# Patient Record
Sex: Male | Born: 1950 | Race: White | Hispanic: No | Marital: Married | Smoking: Never smoker
Health system: Southern US, Community
[De-identification: ages and names within clinical notes are randomized; demographics above are authoritative.]

## PROBLEM LIST (undated history)

## (undated) DIAGNOSIS — I2699 Other pulmonary embolism without acute cor pulmonale: Secondary | ICD-10-CM

## (undated) DIAGNOSIS — M1990 Unspecified osteoarthritis, unspecified site: Secondary | ICD-10-CM

## (undated) DIAGNOSIS — I82409 Acute embolism and thrombosis of unspecified deep veins of unspecified lower extremity: Secondary | ICD-10-CM

## (undated) DIAGNOSIS — E785 Hyperlipidemia, unspecified: Secondary | ICD-10-CM

## (undated) DIAGNOSIS — G459 Transient cerebral ischemic attack, unspecified: Secondary | ICD-10-CM

## (undated) HISTORY — PX: SHOULDER SURGERY: SHX246

## (undated) HISTORY — PX: ELBOW ARTHROPLASTY: SHX928

## (undated) HISTORY — PX: INSERTION OF VENA CAVA FILTER: SHX5871

## (undated) HISTORY — PX: KNEE SURGERY: SHX244

---

## 2009-06-28 ENCOUNTER — Emergency Department (HOSPITAL_COMMUNITY): Admission: EM | Admit: 2009-06-28 | Discharge: 2009-06-28 | Payer: Self-pay | Admitting: Emergency Medicine

## 2009-06-29 ENCOUNTER — Emergency Department (HOSPITAL_COMMUNITY): Admission: EM | Admit: 2009-06-29 | Discharge: 2009-06-29 | Payer: Self-pay | Admitting: Emergency Medicine

## 2012-07-18 ENCOUNTER — Encounter: Payer: Self-pay | Admitting: Orthopedic Surgery

## 2012-07-18 ENCOUNTER — Ambulatory Visit (INDEPENDENT_AMBULATORY_CARE_PROVIDER_SITE_OTHER): Payer: PRIVATE HEALTH INSURANCE

## 2012-07-18 ENCOUNTER — Ambulatory Visit (INDEPENDENT_AMBULATORY_CARE_PROVIDER_SITE_OTHER): Payer: BC Managed Care – PPO | Admitting: Orthopedic Surgery

## 2012-07-18 VITALS — BP 154/98 | Ht 75.0 in | Wt 238.0 lb

## 2012-07-18 DIAGNOSIS — IMO0002 Reserved for concepts with insufficient information to code with codable children: Secondary | ICD-10-CM

## 2012-07-18 DIAGNOSIS — M1710 Unilateral primary osteoarthritis, unspecified knee: Secondary | ICD-10-CM

## 2012-07-18 DIAGNOSIS — M25561 Pain in right knee: Secondary | ICD-10-CM | POA: Insufficient documentation

## 2012-07-18 DIAGNOSIS — Z86711 Personal history of pulmonary embolism: Secondary | ICD-10-CM

## 2012-07-18 DIAGNOSIS — M1712 Unilateral primary osteoarthritis, left knee: Secondary | ICD-10-CM

## 2012-07-18 DIAGNOSIS — M25569 Pain in unspecified knee: Secondary | ICD-10-CM

## 2012-07-18 MED ORDER — DIPHENHYDRAMINE HCL 25 MG PO CAPS: 25.0000 mg | ORAL_CAPSULE | ORAL | Status: DC | PRN

## 2012-07-18 MED ORDER — HYDROCODONE-ACETAMINOPHEN 5-325 MG PO TABS: 1.0000 | ORAL_TABLET | Freq: Four times a day (QID) | ORAL | Status: DC | PRN

## 2012-07-18 NOTE — Patient Instructions (Addendum)
You have received a steroid shot. 15% of patients experience increased pain at the injection site with in the next 24 hours. This is best treated with ice and tylenol extra strength 2 tabs every 8 hours. If you are still having pain please call the office.   Take pain medication as needed with benadryl

## 2012-07-18 NOTE — Progress Notes (Signed)
Patient ID: Joshua Oneal, male   DOB: [birth year], 62 y.o.   MRN: [id number] Chief Complaint  Patient presents with  . 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 He presents now complaining of a mass over the medial aspect of his right knee. So far has only been taking Tylenol for his knee pain which can become 10 out of 10 constant and worse with standing and walking he also complains of increased pain with bending his knee and swelling of his knee  He has other orthopedic problems related to his cervical and lumbar spine  He has a history of easy bleeding and bruising secondary to his Coumadin use. He tells me that his doctor who manages his Coumadin is in [place] and has been managing it from there for 5 years he is currently on 3 mg of Coumadin and he says that keeps his INR from 1.9-2.0 as long as he takes or eats green vegetables  He reported negative findings under the remaining review of systems  He reported a drop in blood pressure second Percocet several several years back he has a history of DVT right knee arthroscopy left elbow surgery shoulder surgery he is on Coumadin and Lipitor  Listed family history is normal  Social history married retired  BP 154/98  Ht 6\' 3"  (1.905 m)  Wt 238 lb (107.956 kg)  BMI 29.75 kg/m2  General appearance is normal, the patient is alert and oriented x3 with normal mood and affect. He ambulates with positive extremely positive for progression angles (he says this is been present since he was a child)  Upper extremity exam  The right and left upper extremity:   Inspection and palpation revealed no abnormalities in the upper extremities.   Range of motion is full without contracture.  Motor exam is normal with grade 5  strength.  The joints are fully reduced without subluxation.  There is no atrophy or tremor and muscle tone is normal.  All joints are stable.   History left knee is not involved inspection was normal range of motion was full strength was normal stability tests were normal skin was intact no para-) normal sensation  He has 125 of knee flexion slight flexion contractures knee is stable he has some medial joint line tenderness small effusion strength is normal skin is intact with multiple areas of Darkened skin primarily in the ankle leg area from his clots  left leg is normal no venous stasis disease on the left normal pulses normal temperature no current edema has some varicosities bilaterally has normal sensation  X-ray shows medial osteoarthritis moderate  Encounter Diagnosis  Name Primary?  . Right knee pain Yes    Very difficult patient, history of pulmonary embolus multiple times, filter, Coumadin with no local management of his medication. Is not a candidate for NSAID therapy  Is not a surgical candidate at this point without multiple specialists involved  He is one of the rare level V medical decision-making patients that I have seen  But we are going to inject his knee and then we are going to give him some pain medicine with Benadryl and see him again in 3 months  Knee  Injection Procedure Note  Pre-operative Diagnosis: right knee oa  Post-operative Diagnosis: same  Indications: pain  Anesthesia: ethyl chloride   Procedure Details   Verbal consent was obtained for the procedure. Time out was completed.The joint was prepped with alcohol, followed by  Ethyl chloride spray and A 20 gauge needle was inserted into the knee via lateral approach; 4ml 1% lidocaine and 1 ml of depomedrol  was then injected into the joint . The needle was removed and the area cleansed and dressed.  Complications:  None; patient tolerated the procedure well.

## 2012-07-24 ENCOUNTER — Telehealth: Payer: Self-pay | Admitting: *Deleted

## 2012-07-24 NOTE — Telephone Encounter (Signed)
Patient called c/o having extreme pain in right knee. States it feels like it is running up from where the cyst is on the right knee. He said "feels like someone sticking an ice pick inside the knee". Patient has tried elevating, he denies swelling. States pain on a scale of 1 - 10 is a 20.States he hasn't done anything differently to cause this pain, it just started after coming out of [employer]. Pharmacy didn't fill pain medicine d/t allergy to first ingredient, but patient states pain is so bad no pain medicine would touch it. I advised patient to go to Emergency Room due to his history of blood clots. Patient advised me that it was not a clot, he knows what that feels like. I still advised him that our Dr. Was in surgery, and  I recommend he got to ED. He did not want to go to [facility] because he felt like they were not equipped to handle his care, so I gave him [facility] address. Patient states he would go there.

## 2012-07-25 ENCOUNTER — Telehealth: Payer: Self-pay | Admitting: Orthopedic Surgery

## 2012-07-25 NOTE — Telephone Encounter (Signed)
Joshua Oneal's wife called for hime with 2 questions: 1.  Can you prescribe another pain medicine, he is allergic to the coating on the Hydrocodone 5/325.  He will need something he can take with the Coumadin He uses [facility] in [place].  2.  He wants to know why you did not order an MRI.  York Spaniel he is in a lot of pain.  Phone # [phone]

## 2012-07-25 NOTE — Telephone Encounter (Signed)
Called patient and relayed DR. Harrison's response.

## 2012-07-25 NOTE — Telephone Encounter (Signed)
Routhe to the nurse to call the patient

## 2012-07-25 NOTE — Telephone Encounter (Signed)
Talk to his primary care doctor ref new pain med script  Call insurance company and they can give him indications for mri of the knee   Also we welcome 2nd opinions if wished to get one

## 2012-10-19 ENCOUNTER — Ambulatory Visit: Payer: PRIVATE HEALTH INSURANCE | Admitting: Orthopedic Surgery

## 2013-10-13 ENCOUNTER — Encounter (HOSPITAL_COMMUNITY): Payer: Self-pay | Admitting: Emergency Medicine

## 2013-10-13 ENCOUNTER — Emergency Department (HOSPITAL_COMMUNITY): Payer: BC Managed Care – PPO

## 2013-10-13 ENCOUNTER — Observation Stay (HOSPITAL_COMMUNITY)
Admission: EM | Admit: 2013-10-13 | Discharge: 2013-10-14 | Disposition: A | Discharge status: Home / Self Care | Payer: BC Managed Care – PPO | Attending: Oncology | Admitting: Oncology

## 2013-10-13 DIAGNOSIS — M79651 Pain in right thigh: Secondary | ICD-10-CM | POA: Diagnosis not present

## 2013-10-13 DIAGNOSIS — R079 Chest pain, unspecified: Principal | ICD-10-CM | POA: Diagnosis present

## 2013-10-13 DIAGNOSIS — Z8673 Personal history of transient ischemic attack (TIA), and cerebral infarction without residual deficits: Secondary | ICD-10-CM | POA: Insufficient documentation

## 2013-10-13 DIAGNOSIS — R0789 Other chest pain: Secondary | ICD-10-CM

## 2013-10-13 DIAGNOSIS — E785 Hyperlipidemia, unspecified: Secondary | ICD-10-CM | POA: Diagnosis present

## 2013-10-13 DIAGNOSIS — Z86711 Personal history of pulmonary embolism: Secondary | ICD-10-CM | POA: Diagnosis not present

## 2013-10-13 DIAGNOSIS — K760 Fatty (change of) liver, not elsewhere classified: Secondary | ICD-10-CM | POA: Diagnosis not present

## 2013-10-13 DIAGNOSIS — Z86718 Personal history of other venous thrombosis and embolism: Secondary | ICD-10-CM | POA: Diagnosis not present

## 2013-10-13 DIAGNOSIS — Z7901 Long term (current) use of anticoagulants: Secondary | ICD-10-CM | POA: Insufficient documentation

## 2013-10-13 HISTORY — DX: Unspecified osteoarthritis, unspecified site: M19.90

## 2013-10-13 HISTORY — DX: Acute embolism and thrombosis of unspecified deep veins of unspecified lower extremity: I82.409

## 2013-10-13 HISTORY — DX: Hyperlipidemia, unspecified: E78.5

## 2013-10-13 HISTORY — DX: Other pulmonary embolism without acute cor pulmonale: I26.99

## 2013-10-13 HISTORY — DX: Transient cerebral ischemic attack, unspecified: G45.9

## 2013-10-13 LAB — CBC WITH DIFFERENTIAL/PLATELET
BASOS PCT: 1 % (ref 0–1)
Basophils Absolute: 0.1 10*3/uL (ref 0.0–0.1)
EOS ABS: 0.1 10*3/uL (ref 0.0–0.7)
EOS PCT: 3 % (ref 0–5)
HCT: 42.7 % (ref 39.0–52.0)
Hemoglobin: 14.6 g/dL (ref 13.0–17.0)
Lymphocytes Relative: 27 % (ref 12–46)
Lymphs Abs: 1.2 10*3/uL (ref 0.7–4.0)
MCH: 29.8 pg (ref 26.0–34.0)
MCHC: 34.2 g/dL (ref 30.0–36.0)
MCV: 87.1 fL (ref 78.0–100.0)
MONO ABS: 0.5 10*3/uL (ref 0.1–1.0)
Monocytes Relative: 11 % (ref 3–12)
NEUTROS ABS: 2.6 10*3/uL (ref 1.7–7.7)
Neutrophils Relative %: 58 % (ref 43–77)
PLATELETS: 181 10*3/uL (ref 150–400)
RBC: 4.9 MIL/uL (ref 4.22–5.81)
RDW: 13.7 % (ref 11.5–15.5)
WBC: 4.6 10*3/uL (ref 4.0–10.5)

## 2013-10-13 LAB — TROPONIN I: Troponin I: <0.3 ng/mL (ref <0.30)

## 2013-10-13 LAB — BASIC METABOLIC PANEL
ANION GAP: 14 (ref 5–15)
BUN: 26 mg/dL — ABNORMAL HIGH (ref 6–23)
CALCIUM: 9.4 mg/dL (ref 8.4–10.5)
CHLORIDE: 103 meq/L (ref 96–112)
CO2: 23 meq/L (ref 19–32)
Creatinine, Ser: 1.32 mg/dL (ref 0.50–1.35)
GFR calc Af Amer: 64 mL/min — ABNORMAL LOW (ref >90)
GFR, EST NON AFRICAN AMERICAN: 55 mL/min — AB (ref >90)
GLUCOSE: 110 mg/dL — AB (ref 70–99)
Potassium: 3.6 mEq/L — ABNORMAL LOW (ref 3.7–5.3)
SODIUM: 140 meq/L (ref 137–147)

## 2013-10-13 LAB — PROTIME-INR
INR: 2.33 — AB (ref 0.00–1.49)
Prothrombin Time: 25.6 seconds — ABNORMAL HIGH (ref 11.6–15.2)

## 2013-10-13 IMAGING — CT CT ANGIO CHEST
1 of 9 series · 14 of 36 positions shown · IV contrast (Iohexol (Omnipaque 350))
Comparison: None.

CLINICAL DATA: Chest tightness. Difficulty breathing. History of
lower extremity deep venous thrombosis

EXAM:
CT ANGIOGRAPHY CHEST WITH CONTRAST
TECHNIQUE: Multidetector CT imaging of the chest was performed using the
standard protocol during bolus administration of intravenous
contrast. Multiplanar CT image reconstructions and MIPs were
obtained to evaluate the vascular anatomy.
CONTRAST:  75mL OMNIPAQUE IOHEXOL 350 MG/ML SOLN

[Series 407: thins pacs · axial · 0.69mm/px · z∈[-404,-105]mm · 14 of 347 slices shown]
[im 24/347  lung]
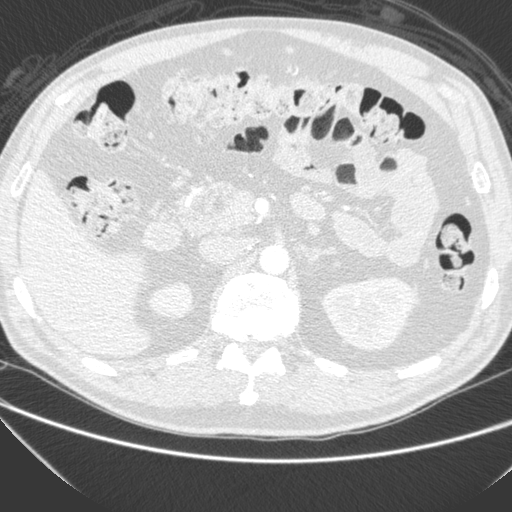
[im 47/347  mediastinal]
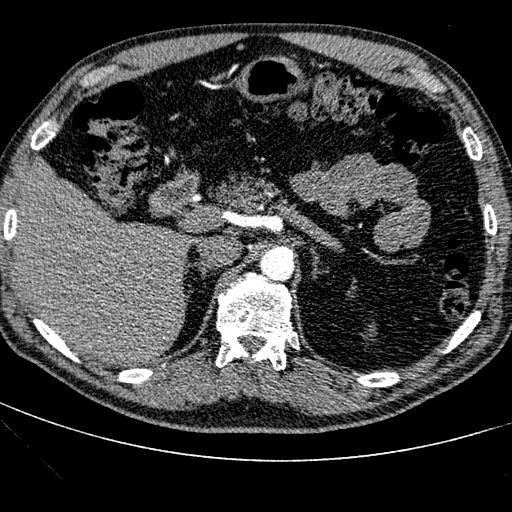
[im 70/347  lung]
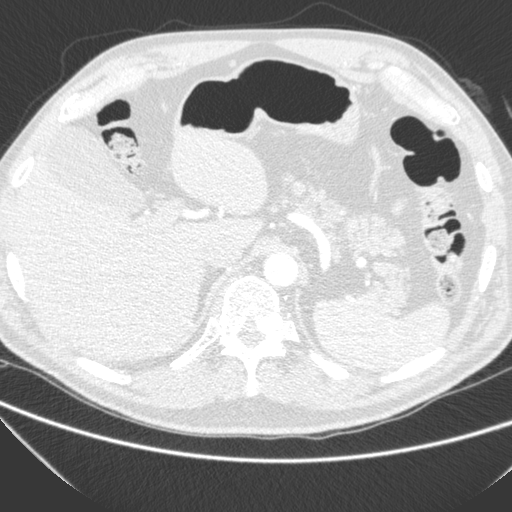
[im 93/347  mediastinal]
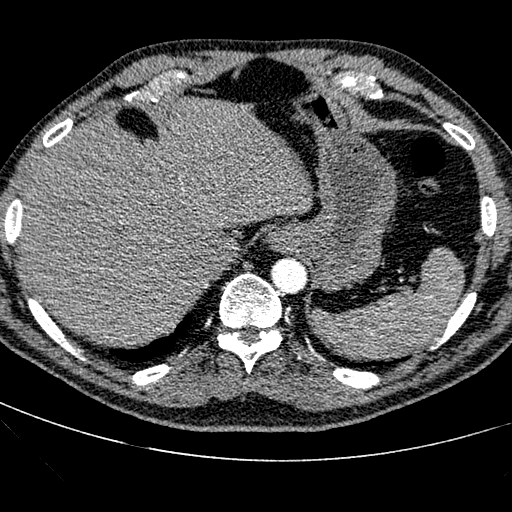
[im 116/347  lung]
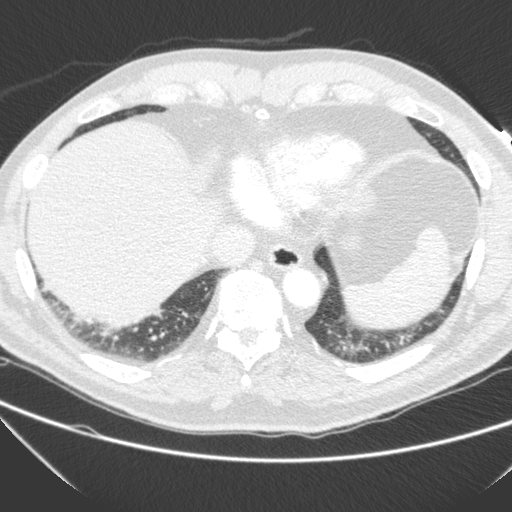
[im 139/347  mediastinal]
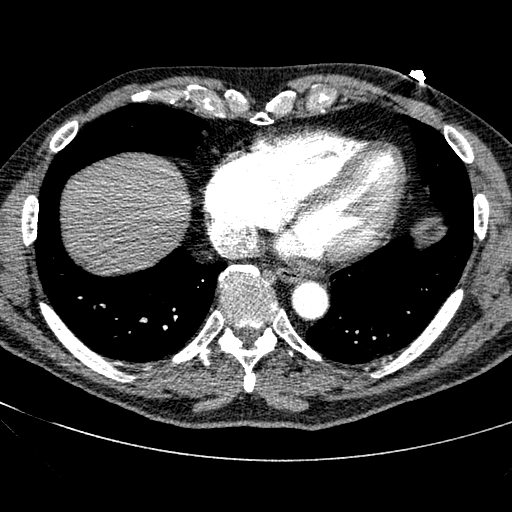
[im 162/347  lung]
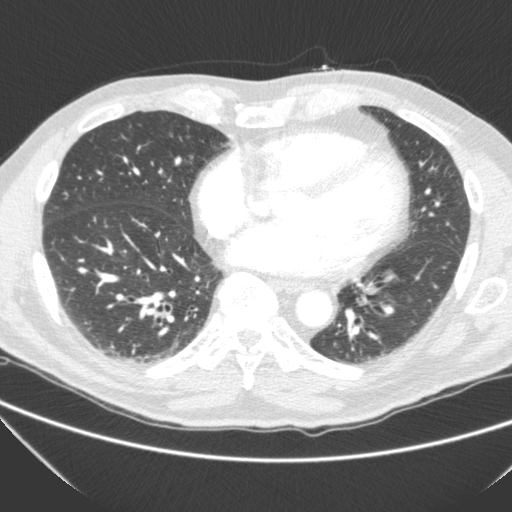
[im 185/347  mediastinal]
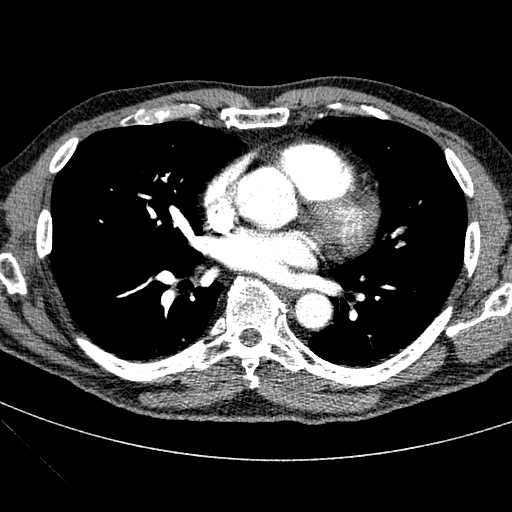
[im 208/347  lung]
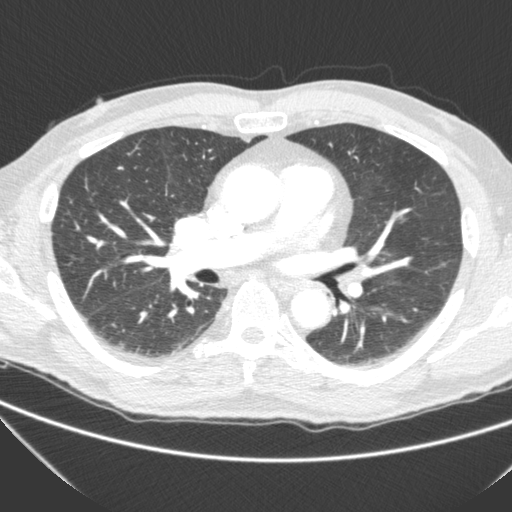
[im 231/347  mediastinal]
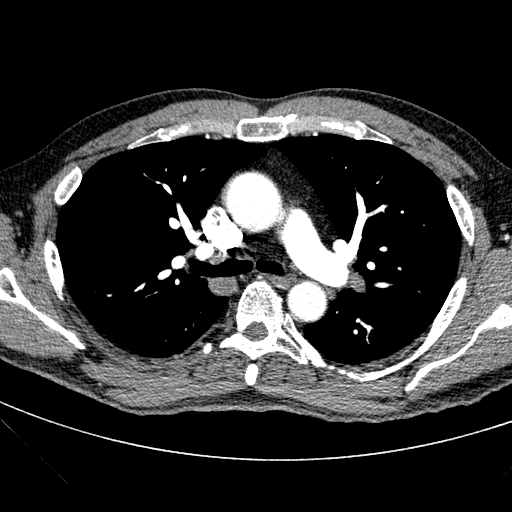
[im 254/347  lung]
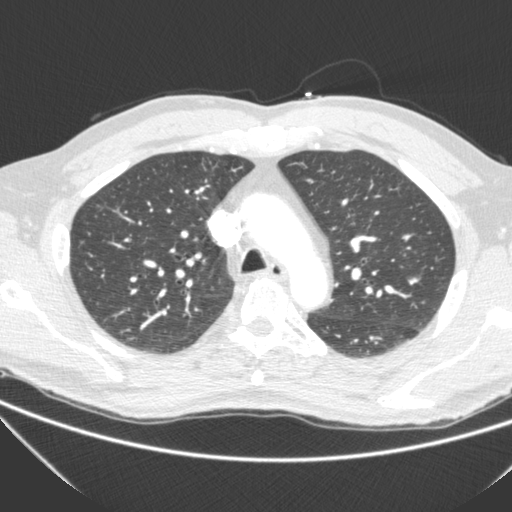
[im 277/347  mediastinal]
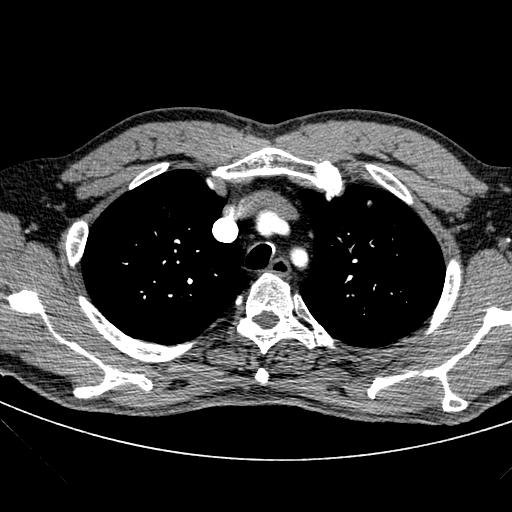
[im 300/347  lung]
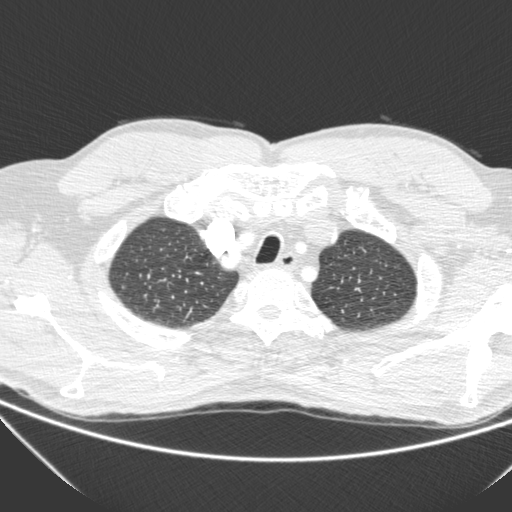
[im 323/347  mediastinal]
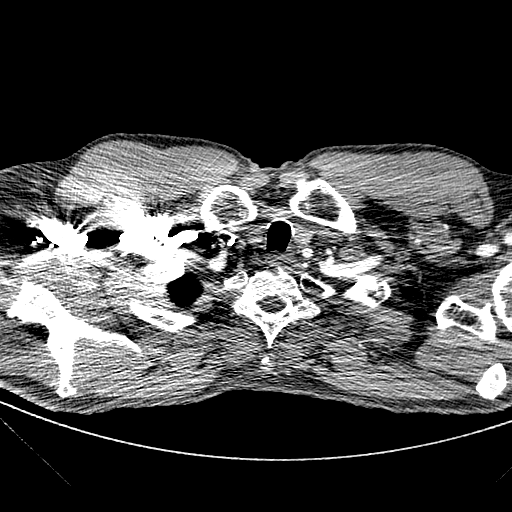

[14 of 36 positions shown; findings below may reference images not displayed]

FINDINGS: There is no demonstrable pulmonary embolus. There is no thoracic
aortic aneurysm or dissection.

There is mild bibasilar atelectatic change. There is no frank edema
or consolidation. There is lower lobe bronchiectatic change
bilaterally. There is milder upper lobe bronchiectatic change
bilaterally. There is no appreciable thoracic adenopathy. The
pericardium is not thickened.

In the visualized upper abdomen there is hepatic steatosis.
Visualized upper abdominal structures otherwise appear unremarkable.
There is degenerative change in the thoracic spine. No blastic or
lytic bone lesions. Visualized thyroid appears unremarkable.

Review of the MIP images confirms the above findings.
IMPRESSION: No demonstrable pulmonary embolus. No edema or consolidation. Mild
lower lobe atelectatic change bilaterally. Evidence of
bronchiectasis bilaterally, primarily in the lower lobes. There is
hepatic steatosis.

## 2013-10-13 MED ORDER — SODIUM CHLORIDE 0.9 % IV SOLN
INTRAVENOUS | Status: AC
  Administered 2013-10-13 (×2): via INTRAVENOUS

## 2013-10-13 MED ORDER — ACETAMINOPHEN 325 MG PO TABS
650.0000 mg | ORAL_TABLET | ORAL | Status: DC | PRN
  Administered 2013-10-13: 650 mg via ORAL
  Filled 2013-10-13: qty 2

## 2013-10-13 MED ORDER — ATORVASTATIN CALCIUM 10 MG PO TABS
10.0000 mg | ORAL_TABLET | Freq: Every day | ORAL | Status: DC
  Administered 2013-10-13: 10 mg via ORAL
  Filled 2013-10-13 (×2): qty 1

## 2013-10-13 MED ORDER — ONDANSETRON HCL 4 MG/2ML IJ SOLN: 4.0000 mg | Freq: Four times a day (QID) | INTRAMUSCULAR | Status: DC | PRN

## 2013-10-13 MED ORDER — WARFARIN SODIUM 3 MG PO TABS
3.0000 mg | ORAL_TABLET | Freq: Every day | ORAL | Status: DC
  Administered 2013-10-13: 3 mg via ORAL
  Filled 2013-10-13 (×2): qty 1

## 2013-10-13 MED ORDER — WARFARIN - PHYSICIAN DOSING INPATIENT: Freq: Every day | Status: DC

## 2013-10-13 MED ORDER — ASPIRIN EC 325 MG PO TBEC
325.0000 mg | DELAYED_RELEASE_TABLET | Freq: Every day | ORAL | Status: DC
  Administered 2013-10-14: 325 mg via ORAL
  Filled 2013-10-13 (×2): qty 1

## 2013-10-13 MED ORDER — IOHEXOL 350 MG/ML SOLN
75.0000 mL | Freq: Once | INTRAVENOUS | Status: AC | PRN
  Administered 2013-10-13: 75 mL via INTRAVENOUS

## 2013-10-13 MED ORDER — SODIUM CHLORIDE 0.9 % IV SOLN
INTRAVENOUS | Status: DC
  Administered 2013-10-13: 06:00:00 via INTRAVENOUS

## 2013-10-13 NOTE — Discharge Instructions (Addendum)
It was a pleasure taking care of you. You were admitted with chest pain. We were concerned about your heart as the source of chest pain. Your cardiac enzyme was normal. Your EKG was normal. We have done an exercise stress test which is also normal. You should follow up with your primary care doctor closely.   Ask your doctor to check your INR and also your kidney function. Your kidney function was mildly abnormal here but it improved. Just make sure it stays normal.   Information on my medicine - Coumadin   (Warfarin)  This medication education was reviewed with me or my healthcare representative as part of my discharge preparation.  The pharmacist that spoke with me during my hospital stay was:  Pasty Spillersobertson, Crystal Stillinger, [facility]  Why was Coumadin prescribed for you? Coumadin was prescribed for you because you have a blood clot or a medical condition that can cause an increased risk of forming blood clots. Blood clots can cause serious health problems by blocking the flow of blood to the heart, lung, or brain. Coumadin can prevent harmful blood clots from forming. As a reminder your indication for Coumadin is:   Blood Clotting Disorder  What test will check on my response to Coumadin? While on Coumadin (warfarin) you will need to have an INR test regularly to ensure that your dose is keeping you in the desired range. The INR (international normalized ratio) number is calculated from the result of the laboratory test called prothrombin time (PT).  If an INR APPOINTMENT HAS NOT ALREADY BEEN MADE FOR YOU please schedule an appointment to have this lab work done by your health care provider within 7 days. Your INR goal is usually a number between:  2 to 3 or your provider may give you a more narrow range like 2-2.5.  Ask your health care provider during an office visit what your goal INR is.  What  do you need to  know  About  COUMADIN? Take Coumadin (warfarin) exactly as prescribed by your  healthcare provider about the same time each day.  DO NOT stop taking without talking to the doctor who prescribed the medication.  Stopping without other blood clot prevention medication to take the place of Coumadin may increase your risk of developing a new clot or stroke.  Get refills before you run out.  What do you do if you miss a dose? If you miss a dose, take it as soon as you remember on the same day then continue your regularly scheduled regimen the next day.  Do not take two doses of Coumadin at the same time.  Important Safety Information A possible side effect of Coumadin (Warfarin) is an increased risk of bleeding. You should call your healthcare provider right away if you experience any of the following:   Bleeding from an injury or your nose that does not stop.   Unusual colored urine (red or dark brown) or unusual colored stools (red or black).   Unusual bruising for unknown reasons.   A serious fall or if you hit your head (even if there is no bleeding).  Some foods or medicines interact with Coumadin (warfarin) and might alter your response to warfarin. To help avoid this:   Eat a balanced diet, maintaining a consistent amount of Vitamin K.   Notify your provider about major diet changes you plan to make.   Avoid alcohol or limit your intake to 1 drink for women and 2 drinks for men per  day. (1 drink is 5 oz. wine, 12 oz. beer, or 1.5 oz. liquor.)  Make sure that ANY health care provider who prescribes medication for you knows that you are taking Coumadin (warfarin).  Also make sure the healthcare provider who is monitoring your Coumadin knows when you have started a new medication including herbals and non-prescription products.  Coumadin (Warfarin)  Major Drug Interactions  Increased Warfarin Effect Decreased Warfarin Effect  Alcohol (large quantities) Antibiotics (esp. Septra/Bactrim, Flagyl, Cipro) Amiodarone (Cordarone) Aspirin (ASA) Cimetidine  (Tagamet) Megestrol (Megace) NSAIDs (ibuprofen, naproxen, etc.) Piroxicam (Feldene) Propafenone (Rythmol SR) Propranolol (Inderal) Isoniazid (INH) Posaconazole (Noxafil) Barbiturates (Phenobarbital) Carbamazepine (Tegretol) Chlordiazepoxide (Librium) Cholestyramine (Questran) Griseofulvin Oral Contraceptives Rifampin Sucralfate (Carafate) Vitamin K   Coumadin (Warfarin) Major Herbal Interactions  Increased Warfarin Effect Decreased Warfarin Effect  Garlic Ginseng Ginkgo biloba Coenzyme Q10 Green tea St. Johns wort    Coumadin (Warfarin) FOOD Interactions  Eat a consistent number of servings per week of foods HIGH in Vitamin K (1 serving =  cup)  Collards (cooked, or boiled & drained) Kale (cooked, or boiled & drained) Mustard greens (cooked, or boiled & drained) Parsley *serving size only =  cup Spinach (cooked, or boiled & drained) Swiss chard (cooked, or boiled & drained) Turnip greens (cooked, or boiled & drained)  Eat a consistent number of servings per week of foods MEDIUM-HIGH in Vitamin K (1 serving = 1 cup)  Asparagus (cooked, or boiled & drained) Broccoli (cooked, boiled & drained, or raw & chopped) Brussel sprouts (cooked, or boiled & drained) *serving size only =  cup Lettuce, raw (green leaf, endive, romaine) Spinach, raw Turnip greens, raw & chopped   These websites have more information on Coumadin (warfarin):  http://www.king-russell.com/www.coumadin.com; https://www.hines.net/www.ahrq.gov/consumer/coumadin.htm;

## 2013-10-13 NOTE — ED Notes (Signed)
Pt states he was getting ready to go to the restroom when he began having rt leg pain that went up into his inner thigh, pt also had pain in lower left ribcage, and diaphoresis. Per EMS pt began to get sob while they were assessing him for transport. Pt has hx of 3 DVTs, and 3 PEs. Pt is pain free now, states he takes coumadin to manage his sx.

## 2013-10-13 NOTE — Progress Notes (Signed)
Transported to CT 

## 2013-10-13 NOTE — ED Provider Notes (Signed)
CSN: [id number]     Arrival date & time 10/13/13  0503 History   First MD Initiated Contact with Patient 10/13/13 0507     Chief Complaint  Patient presents with  . Shortness of Breath  . Leg Pain     (Consider location/radiation/quality/duration/timing/severity/associated sxs/prior Treatment) HPI Comments: Patient here complaining of sudden onset of right-sided leg pain which then radiated to the left side of his chest. States she became dizzy and diaphoretic along with dyspneic. The discomfort didn't travel down his left arm. Symptoms lasted for 20 minutes and resolve spontaneously. History of pulmonary embolism in the past and this is different. EMS was called and I reviewed their EKG which showed no signs of STEMI. Patient is currently pain-free. Denies any recent history of fever or cough. Does have some right-sided leg swelling. He is on Coumadin and states that he has been compliant  Patient is a 63 y.o. male presenting with shortness of breath and leg pain. The history is provided by the patient.  Shortness of Breath Leg Pain   No past medical history on file. No past surgical history on file. No family history on file. History  Substance Use Topics  . Smoking status: Never Smoker   . Smokeless tobacco: Not on file  . Alcohol Use: No    Review of Systems  Respiratory: Positive for shortness of breath.   All other systems reviewed and are negative.     Allergies  Percocet  Home Medications   Prior to Admission medications   Medication Sig Start Date End Date Taking? Authorizing Provider  diphenhydrAMINE (BENADRYL) 25 mg capsule Take 1 capsule (25 mg total) by mouth every 4 (four) hours as needed for itching. 07/18/12   Vickki HearingStanley E Harrison, MD  HYDROcodone-acetaminophen (NORCO) 5-325 MG per tablet Take 1 tablet by mouth every 6 (six) hours as needed for pain. 07/18/12   Vickki HearingStanley E Harrison, MD   SpO2 100% Physical Exam  Nursing note and vitals  reviewed. Constitutional: He is oriented to person, place, and time. He appears well-developed and well-nourished.  Non-toxic appearance. No distress.  HENT:  Head: Normocephalic and atraumatic.  Eyes: Conjunctivae, EOM and lids are normal. Pupils are equal, round, and reactive to light.  Neck: Normal range of motion. Neck supple. No tracheal deviation present. No mass present.  Cardiovascular: Normal rate, regular rhythm and normal heart sounds.  Exam reveals no gallop.   No murmur heard. Pulmonary/Chest: Effort normal and breath sounds normal. No stridor. No respiratory distress. He has no decreased breath sounds. He has no wheezes. He has no rhonchi. He has no rales.  Abdominal: Soft. Normal appearance and bowel sounds are normal. He exhibits no distension. There is no tenderness. There is no rebound and no CVA tenderness.  Musculoskeletal: Normal range of motion. He exhibits no edema and no tenderness.       Legs: Neurological: He is alert and oriented to person, place, and time. He has normal strength. No cranial nerve deficit or sensory deficit. GCS eye subscore is 4. GCS verbal subscore is 5. GCS motor subscore is 6.  Skin: Skin is warm and dry. No abrasion and no rash noted.  Psychiatric: He has a normal mood and affect. His speech is normal and behavior is normal.    ED Course  Procedures (including critical care time) Labs Review Labs Reviewed  TROPONIN I  CBC WITH DIFFERENTIAL  BASIC METABOLIC PANEL  PROTIME-INR    Imaging Review No results found.   EKG  Interpretation   Date/Time:  Saturday October 13 2013 05:10:04 EDT Ventricular Rate:  69 PR Interval:  164 QRS Duration: 86 QT Interval:  433 QTC Calculation: 464 R Axis:   20 Text Interpretation:  Sinus rhythm Confirmed by Opal Dinning  MD, Baelyn Doring ([id number])  on 10/13/2013 6:24:09 AM      MDM   Final diagnoses:  None    No evidence of pulmonary embolus on CT. Patient symptoms concerning for unstable angina and  will have patient admitted to hospitalist service    Toy BakerAnthony T Takao Lizer, MD 10/13/13 [phone]

## 2013-10-13 NOTE — H&P (Signed)
Date: 10/13/2013               Patient Name:  Joshua Oneal MRN: [id number]  DOB: [birth year] Age / Sex: 63 y.o., male   PCP: No Pcp Per Patient         Medical Service: Internal Medicine Teaching Service         Attending Physician: Dr. Levert Feinstein, MD    First Contact: Dr. Tasia Catchings Pager: [phone]  Second Contact: Dr. Mikey Bussing Pager: [phone]       After Hours (After 5p/  First Contact Pager: [phone]  weekends / holidays): Second Contact Pager: [phone]   Chief Complaint: right thigh/groin pain shooting upto left chest with exertion  History of Present Illness:   63 yo male with hx of recurrent PE and DVTs, HLD here with right thigh cramping pain radiating into the groin and then to his left chest with activity that lasted 20 mins and resolved. He had cramping pain on the leg in the past but the chest pain is new. It was pressure like, didn't radiate to his arms. He did have some nausea without emesis. No numbness/tingling. Had some SOB during this episode and felt like passing out but didn't pass out. He has no chest pain currently. Has some mild right thigh pain. No dysuria or discharge  He has extensive hx of DVT. Doesn't know if coagulopathy workup was done.  First episode happened after a knee surgery. He had severe clot burden and mentioned that his valves became dysfunctional on the right leg venous system from the clot. He also had massive PE following that. Had IVC filter placed. Is on coumadin 3mg  daily. INR not followed closely as his PCP is in [place] and he only sees him occasionally. He has been in [place] last 5 years and planning to move back to [place] next month. Nov 27, 2013. He sees his PCP whenever he is in [place].   Denies any cardiac or renal hx. Was told he is prediabetic in the past, but then he lost ~30 lbs and was able to lower his glucose.   Meds: Current Facility-Administered Medications  Medication Dose Route Frequency Provider Last Rate Last Dose  . 0.9 %   sodium chloride infusion   Intravenous Continuous Gust Rung, DO 125 mL/hr at 10/13/13 1844    . acetaminophen (TYLENOL) tablet 650 mg  650 mg Oral Q4H PRN Gust Rung, DO      . aspirin EC tablet 325 mg  325 mg Oral Daily Gust Rung, DO      . atorvastatin (LIPITOR) tablet 10 mg  10 mg Oral Daily Gust Rung, DO   10 mg at 10/13/13 1231  . ondansetron (ZOFRAN) injection 4 mg  4 mg Intravenous Q6H PRN Gust Rung, DO      . warfarin (COUMADIN) tablet 3 mg  3 mg Oral q1800 Gust Rung, DO   3 mg at 10/13/13 1751  . Warfarin - Physician Dosing Inpatient   Does not apply q1800 Dannielle Karvonen [place], [facility]        Allergies: Allergies as of 10/13/2013 - Review Complete 10/13/2013  Allergen Reaction Noted  . Percocet [oxycodone-acetaminophen] Anaphylaxis 07/18/2012   Past Medical History  Diagnosis Date  . DVT (deep venous thrombosis)     pt had 3  . PE (pulmonary embolism)     Pt had 3 first in 1998 after knee surgery  . TIA (transient ischemic attack)   .  Arthritis   . Hyperlipidemia    Past Surgical History  Procedure Laterality Date  . Shoulder surgery    . Insertion of vena cava filter    . Elbow arthroplasty    . Knee surgery      arthroscopic   History reviewed. No pertinent family history. History   Social History  . Marital Status: Married    Spouse Name: N/A    Number of Children: N/A  . Years of Education: N/A   Occupational History  . 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 Smoking status: Never Smoker   . Smokeless tobacco: Not on file  . Alcohol Use: No  . Drug Use: No  . Sexual Activity: Not on file   Other Topics Concern  . Not on file   Social History Narrative  . No narrative on file    Review of Systems: Review of Systems  Constitutional: Negative for fever, chills, weight loss, malaise/fatigue and diaphoresis.  HENT: Negative.   Eyes: Negative.   Respiratory: Positive for  shortness of breath. Negative for cough, hemoptysis, sputum production and wheezing.   Cardiovascular: Positive for chest pain. Negative for palpitations, orthopnea, claudication, leg swelling and PND.  Gastrointestinal: Positive for nausea. Negative for heartburn, vomiting, abdominal pain, diarrhea, constipation, blood in stool and melena.  Genitourinary: Negative.   Musculoskeletal: Negative.   Skin: Negative.   Neurological: Negative.  Negative for weakness.  Endo/Heme/Allergies: Negative.   Psychiatric/Behavioral: Negative.      Physical Exam: Blood pressure 138/73, pulse 68, temperature 97.6 F (36.4 C), temperature source Oral, resp. rate 17, height 6\' 3"  (1.905 m), weight 104.327 kg (230 lb), SpO2 100.00%. Physical Exam  Constitutional: He is oriented to person, place, and time. He appears well-developed and well-nourished. No distress.  HENT:  Head: Normocephalic and atraumatic.  Right Ear: External ear normal.  Left Ear: External ear normal.  Nose: Nose normal.  Mouth/Throat: Oropharynx is clear and moist.  Eyes: Conjunctivae and EOM are normal. Pupils are equal, round, and reactive to light. Right eye exhibits no discharge. Left eye exhibits no discharge. No scleral icterus.  Neck: Normal range of motion. Neck supple. No JVD present. No tracheal deviation present. No thyromegaly present.  Cardiovascular: Normal rate, regular rhythm, normal heart sounds and intact distal pulses.  Exam reveals no gallop and no friction rub.   No murmur heard. Respiratory: Effort normal and breath sounds normal. No respiratory distress. He has no wheezes. He exhibits no tenderness.  GI: Soft. Bowel sounds are normal. He exhibits no distension and no mass. There is no tenderness. There is no rebound and no guarding.  Musculoskeletal: Normal range of motion. He exhibits no edema and no tenderness.  Has venous statis skin changes on right lower leg. Right calf bigger than left but this is chronic  per patient. No TTP. No erythema. No warmth.   Neurological: He is alert and oriented to person, place, and time. He has normal reflexes. He displays normal reflexes. No cranial nerve deficit. He exhibits normal muscle tone. Coordination normal.  Skin: Skin is warm. He is not diaphoretic.  Psychiatric: He has a normal mood and affect.     Lab results: Basic Metabolic Panel:  Recent Labs  16/10/9608/10/15 0523  NA 140  K 3.6*  CL 103  CO2 23  GLUCOSE 110*  BUN 26*  CREATININE 1.32  CALCIUM 9.4   Liver Function Tests: No results  found for this basename: AST, ALT, ALKPHOS, BILITOT, PROT, ALBUMIN,  in the last 72 hours No results found for this basename: LIPASE, AMYLASE,  in the last 72 hours No results found for this basename: AMMONIA,  in the last 72 hours CBC:  Recent Labs  10/13/13 0523  WBC 4.6  NEUTROABS 2.6  HGB 14.6  HCT 42.7  MCV 87.1  PLT 181   Cardiac Enzymes:  Recent Labs  10/13/13 0523 10/13/13 1130 10/13/13 1432  TROPONINI <0.30 <0.30 <0.30   BNP: No results found for this basename: PROBNP,  in the last 72 hours D-Dimer: No results found for this basename: DDIMER,  in the last 72 hours CBG: No results found for this basename: GLUCAP,  in the last 72 hours Hemoglobin A1C: No results found for this basename: HGBA1C,  in the last 72 hours Fasting Lipid Panel: No results found for this basename: CHOL, HDL, LDLCALC, TRIG, CHOLHDL, LDLDIRECT,  in the last 72 hours Thyroid Function Tests: No results found for this basename: TSH, T4TOTAL, FREET4, T3FREE, THYROIDAB,  in the last 72 hours Anemia Panel: No results found for this basename: VITAMINB12, FOLATE, FERRITIN, TIBC, IRON, RETICCTPCT,  in the last 72 hours Coagulation:  Recent Labs  10/13/13 0523  LABPROT 25.6*  INR 2.33*   Urine Drug Screen: Drugs of Abuse  No results found for this basename: labopia,  cocainscrnur,  labbenz,  amphetmu,  thcu,  labbarb    Alcohol Level: No results found for  this basename: ETH,  in the last 72 hours Urinalysis: No results found for this basename: COLORURINE, APPERANCEUR, LABSPEC, PHURINE, GLUCOSEU, HGBUR, BILIRUBINUR, KETONESUR, PROTEINUR, UROBILINOGEN, NITRITE, LEUKOCYTESUR,  in the last 72 hours Misc. Labs:   Imaging results:  Ct Angio Chest Pe W/cm &/or Wo Cm  10/13/2013   CLINICAL DATA:  Chest tightness. Difficulty breathing. History of lower extremity deep venous thrombosis  EXAM: CT ANGIOGRAPHY CHEST WITH CONTRAST  TECHNIQUE: Multidetector CT imaging of the chest was performed using the standard protocol during bolus administration of intravenous contrast. Multiplanar CT image reconstructions and MIPs were obtained to evaluate the vascular anatomy.  CONTRAST:  75mL OMNIPAQUE IOHEXOL 350 MG/ML SOLN  COMPARISON:  None.  FINDINGS: There is no demonstrable pulmonary embolus. There is no thoracic aortic aneurysm or dissection.  There is mild bibasilar atelectatic change. There is no frank edema or consolidation. There is lower lobe bronchiectatic change bilaterally. There is milder upper lobe bronchiectatic change bilaterally. There is no appreciable thoracic adenopathy. The pericardium is not thickened.  In the visualized upper abdomen there is hepatic steatosis. Visualized upper abdominal structures otherwise appear unremarkable. There is degenerative change in the thoracic spine. No blastic or lytic bone lesions. Visualized thyroid appears unremarkable.  Review of the MIP images confirms the above findings.  IMPRESSION: No demonstrable pulmonary embolus. No edema or consolidation. Mild lower lobe atelectatic change bilaterally. Evidence of bronchiectasis bilaterally, primarily in the lower lobes. There is hepatic steatosis.   Electronically Signed   By: Bretta Bang M.D.   On: 10/13/2013 07:38    Other results: EKG: normal EKG, normal sinus rhythm, unchanged from previous tracings, normal sinus rhythm.  Assessment & Plan by Problem: Active  Problems:   History of pulmonary embolism   Chest pain   Hepatic steatosis   History of DVT (deep vein thrombosis)   Right thigh pain   Hyperlipidemia  Chest pain - Heart score 4. Has HLD. CP cocerning of ischemia given onset with exertion and lasting 20 mins.  EKG normal. - repeat EKG tomorrow. Cycle trops, oxygen as needed - will order exercising stress test tomorrow - no note whether EMS gave asa. Will give 325mg  asa  Recurrent DVT and PE - no PE seen on CT angio. - continue coumaind 3mg . He is therapeutic.  Acute vs chronic kidney disease - Crt 1.3. No baseline - he did receive IV contrast. Will continue IVF 125ns overnight.  HLD - continue lipitor.   Dispo: Disposition is deferred at this time, awaiting improvement of current medical problems. Anticipated discharge in approximately 1-2 day(s).   The patient does not have a current PCP (No Pcp Per Patient) and [facility] after discharge.  The patient does not have transportation limitations that hinder transportation to clinic appointments.  Signed: Hyacinth Meekerasrif Raileigh Sabater, MD 10/13/2013, 7:08 PM

## 2013-10-14 DIAGNOSIS — E785 Hyperlipidemia, unspecified: Secondary | ICD-10-CM

## 2013-10-14 DIAGNOSIS — R079 Chest pain, unspecified: Secondary | ICD-10-CM

## 2013-10-14 DIAGNOSIS — Z86718 Personal history of other venous thrombosis and embolism: Secondary | ICD-10-CM

## 2013-10-14 DIAGNOSIS — K760 Fatty (change of) liver, not elsewhere classified: Secondary | ICD-10-CM

## 2013-10-14 DIAGNOSIS — M79651 Pain in right thigh: Secondary | ICD-10-CM

## 2013-10-14 DIAGNOSIS — Z86711 Personal history of pulmonary embolism: Secondary | ICD-10-CM

## 2013-10-14 LAB — BASIC METABOLIC PANEL
Anion gap: 13 (ref 5–15)
BUN: 17 mg/dL (ref 6–23)
CALCIUM: 8.7 mg/dL (ref 8.4–10.5)
CO2: 23 mEq/L (ref 19–32)
Chloride: 106 mEq/L (ref 96–112)
Creatinine, Ser: 1.1 mg/dL (ref 0.50–1.35)
GFR calc Af Amer: 79 mL/min — ABNORMAL LOW (ref >90)
GFR, EST NON AFRICAN AMERICAN: 69 mL/min — AB (ref >90)
Glucose, Bld: 87 mg/dL (ref 70–99)
Potassium: 3.8 mEq/L (ref 3.7–5.3)
SODIUM: 142 meq/L (ref 137–147)

## 2013-10-14 LAB — PROTIME-INR
INR: 2.53 — AB (ref 0.00–1.49)
PROTHROMBIN TIME: 27.3 s — AB (ref 11.6–15.2)

## 2013-10-14 NOTE — Discharge Summary (Signed)
Name: Joshua Oneal MRN: [id number] DOB: 17-May-1950 63 y.o. PCP: No Pcp Per Patient  Date of Admission: 10/13/2013  5:03 AM Date of Discharge: 10/14/2013 Attending Physician: Levert Feinstein, MD  Discharge Diagnosis:  Active Problems:   History of pulmonary embolism   Chest pain   Hepatic steatosis   History of DVT (deep vein thrombosis)   Right thigh pain   Hyperlipidemia  Discharge Medications:   Medication List         atorvastatin 10 MG tablet  Commonly known as:  LIPITOR  Take 10 mg by mouth daily.     warfarin 3 MG tablet  Commonly known as:  COUMADIN  Take 3 mg by mouth daily.        Disposition and follow-up:   [name] was discharged from [facility] in Stable condition.  At the hospital follow up visit please address:  1.  Please assess if he still has chest pain. His stress test was normal here.  2.  Labs / imaging needed at time of follow-up: INR, BMP  3.  Pending labs/ test needing follow-up:   Follow-up Appointments:     Follow-up Information   Schedule an appointment as soon as possible for a visit with No PCP Per Patient.   Specialty:  General Practice      Discharge Instructions:   Consultations:    Procedures Performed:  Ct Angio Chest Pe W/cm &/or Wo Cm  10/13/2013   CLINICAL DATA:  Chest tightness. Difficulty breathing. History of lower extremity deep venous thrombosis  EXAM: CT ANGIOGRAPHY CHEST WITH CONTRAST  TECHNIQUE: Multidetector CT imaging of the chest was performed using the standard protocol during bolus administration of intravenous contrast. Multiplanar CT image reconstructions and MIPs were obtained to evaluate the vascular anatomy.  CONTRAST:  75mL OMNIPAQUE IOHEXOL 350 MG/ML SOLN  COMPARISON:  None.  FINDINGS: There is no demonstrable pulmonary embolus. There is no thoracic aortic aneurysm or dissection.  There is mild bibasilar atelectatic change. There is no frank edema or  consolidation. There is lower lobe bronchiectatic change bilaterally. There is milder upper lobe bronchiectatic change bilaterally. There is no appreciable thoracic adenopathy. The pericardium is not thickened.  In the visualized upper abdomen there is hepatic steatosis. Visualized upper abdominal structures otherwise appear unremarkable. There is degenerative change in the thoracic spine. No blastic or lytic bone lesions. Visualized thyroid appears unremarkable.  Review of the MIP images confirms the above findings.  IMPRESSION: No demonstrable pulmonary embolus. No edema or consolidation. Mild lower lobe atelectatic change bilaterally. Evidence of bronchiectasis bilaterally, primarily in the lower lobes. There is hepatic steatosis.   Electronically Signed   By: Bretta Bang M.D.   On: 10/13/2013 07:38    Exercise stress test: normal.  Admission HPI:   63 yo male with hx of recurrent PE and DVTs, HLD here with right thigh cramping pain radiating into the groin and then to his left chest with activity that lasted 20 mins and resolved. He had cramping pain on the leg in the past but the chest pain is new. It was pressure like, didn't radiate to his arms. He did have some nausea without emesis. No numbness/tingling. Had some SOB during this episode and felt like passing out but didn't pass out. He has no chest pain currently. Has some mild right thigh pain. No dysuria or discharge  He has extensive hx of DVT. Doesn't know if coagulopathy workup was done. First episode happened after a knee  surgery. He had severe clot burden and mentioned that his valves became dysfunctional on the right leg venous system from the clot. He also had massive PE following that. Had IVC filter placed. Is on coumadin 3mg  daily. INR not followed closely as his PCP is in [place] and he only sees him occasionally. He has been in [place] last 5 years and planning to move back to [place] next month. Nov 27, 2013. He sees his PCP whenever  he is in [place].  Denies any cardiac or renal hx. Was told he is prediabetic in the past, but then he lost ~30 lbs and was able to lower his glucose.    Hospital Course by problem list: Active Problems:   History of pulmonary embolism   Chest pain   Hepatic steatosis   History of DVT (deep vein thrombosis)   Right thigh pain   Hyperlipidemia  63 yo male with hx of recurrent DVTs and massive PE on coumadin comes in with chest pain.  Chest pain - high concern for myocardial ischemia given heart score of 4. This occurred with exertion, also has some SOB with exertion. Lasted 20 mins. EKG normal and trops neg x3.  - exercise stress test was normal. CP could have been from another cause such as anxiety from the leg pain that he was having. Should follow up with PCP closely. Recurrent DVT and PE  Had first DVT with massive PE on 1996 after knee surgery. Then had 2nd soon after while on anti-coag, had IVC placed but then had third DVT again. Patient stated he was on only 15mg  lovenox which might have been the reason for the 3rd massive DVT. Has been on coumadin since then. Has been stable INR on 3mg  coumadin  - INR therapeutic. Continue coumadin 3mg . Needs INR followed even though patient states it has been stable for a long time Right thigh pain - chronic. Likely from previous large DVTs causing vascular dysfunction.  Acute vs chronic kidney disease - crt 1.3. No baseline  - improved with IVF. HLD - lipitor continued.   Discharge Vitals:   BP 120/62  Pulse 70  Temp(Src) 98.4 F (36.9 C) (Oral)  Resp 17  Ht 6\' 3"  (1.905 m)  Wt 104.327 kg (230 lb)  BMI 28.75 kg/m2  SpO2 100%  Discharge Labs:  Results for orders placed during the hospital encounter of 10/13/13 (from the past 24 hour(s))  PROTIME-INR     Status: Abnormal   Collection Time    10/14/13  4:11 AM      Result Value Ref Range   Prothrombin Time 27.3 (*) 11.6 - 15.2 seconds   INR 2.53 (*) 0.00 - 1.49  BASIC METABOLIC PANEL      Status: Abnormal   Collection Time    10/14/13  4:11 AM      Result Value Ref Range   Sodium 142  137 - 147 mEq/L   Potassium 3.8  3.7 - 5.3 mEq/L   Chloride 106  96 - 112 mEq/L   CO2 23  19 - 32 mEq/L   Glucose, Bld 87  70 - 99 mg/dL   BUN 17  6 - 23 mg/dL   Creatinine, Ser 1.611.10  0.50 - 1.35 mg/dL   Calcium 8.7  8.4 - 09.610.5 mg/dL   GFR calc non Af Amer 69 (*) >90 mL/min   GFR calc Af Amer 79 (*) >90 mL/min   Anion gap 13  5 - 15    Signed: Doralee Kocak  Malkie Wille, MD 10/14/2013, 4:10 PM    Services Ordered on Discharge:  Equipment Ordered on Discharge:

## 2013-10-14 NOTE — Progress Notes (Signed)
Subjective: Doing well. No longer has chest pain. Leg pain is also better. Wants to go home. Wants to follow with his PCP at [place].   Objective: Vital signs in last 24 hours: Filed Vitals:   10/13/13 0909 10/13/13 1520 10/13/13 2014 10/14/13 0454  BP: 135/76 138/73 135/72 138/82  Pulse: 62 68 72 58  Temp: 98.2 F (36.8 C) 97.6 F (36.4 C) 97.8 F (36.6 C) 97.7 F (36.5 C)  TempSrc: Oral Oral Oral Oral  Resp: 18 17 18 18   Height: 6\' 3"  (1.905 m)     Weight: 104.327 kg (230 lb)     SpO2: 98% 100% 98% 100%   Weight change:   Intake/Output Summary (Last 24 hours) at 10/14/13 1204 Last data filed at 10/14/13 0816  Gross per 24 hour  Intake    240 ml  Output    450 ml  Net   -210 ml   Vitals reviewed. General: resting in bed, NAD HEENT: PERRL, EOMI, no scleral icterus Cardiac: RRR, no rubs, murmurs or gallops Pulm: clear to auscultation bilaterally, no wheezes, rales, or rhonchi Abd: soft, nontender, nondistended, BS present Ext: warm and well perfused, no pedal edema. Right calf > left but this is chronic per patient. No TTP, warmth, or erythema. Has chronic venous statis skin changes on right leg. Neuro: alert and oriented X3, cranial nerves II-XII grossly intact, strength and sensation to light touch equal in bilateral upper and lower extremities  Lab Results: Basic Metabolic Panel:  Recent Labs Lab 10/13/13 0523 10/14/13 0411  NA 140 142  K 3.6* 3.8  CL 103 106  CO2 23 23  GLUCOSE 110* 87  BUN 26* 17  CREATININE 1.32 1.10  CALCIUM 9.4 8.7   Liver Function Tests: No results found for this basename: AST, ALT, ALKPHOS, BILITOT, PROT, ALBUMIN,  in the last 168 hours No results found for this basename: LIPASE, AMYLASE,  in the last 168 hours No results found for this basename: AMMONIA,  in the last 168 hours CBC:  Recent Labs Lab 10/13/13 0523  WBC 4.6  NEUTROABS 2.6  HGB 14.6  HCT 42.7  MCV 87.1  PLT 181   Cardiac Enzymes:  Recent Labs Lab  10/13/13 0523 10/13/13 1130 10/13/13 1432  TROPONINI <0.30 <0.30 <0.30   BNP: No results found for this basename: PROBNP,  in the last 168 hours D-Dimer: No results found for this basename: DDIMER,  in the last 168 hours CBG: No results found for this basename: GLUCAP,  in the last 168 hours Hemoglobin A1C: No results found for this basename: HGBA1C,  in the last 168 hours Fasting Lipid Panel: No results found for this basename: CHOL, HDL, LDLCALC, TRIG, CHOLHDL, LDLDIRECT,  in the last 168 hours Thyroid Function Tests: No results found for this basename: TSH, T4TOTAL, FREET4, T3FREE, THYROIDAB,  in the last 168 hours Coagulation:  Recent Labs Lab 10/13/13 0523 10/14/13 0411  LABPROT 25.6* 27.3*  INR 2.33* 2.53*   Anemia Panel: No results found for this basename: VITAMINB12, FOLATE, FERRITIN, TIBC, IRON, RETICCTPCT,  in the last 168 hours Urine Drug Screen: Drugs of Abuse  No results found for this basename: labopia, cocainscrnur, labbenz, amphetmu, thcu, labbarb    Alcohol Level: No results found for this basename: ETH,  in the last 168 hours Urinalysis: No results found for this basename: COLORURINE, APPERANCEUR, LABSPEC, PHURINE, GLUCOSEU, HGBUR, BILIRUBINUR, KETONESUR, PROTEINUR, UROBILINOGEN, NITRITE, LEUKOCYTESUR,  in the last 168 hours Misc. Labs:  Micro Results: No results  found for this or any previous visit (from the past 240 hour(s)). Studies/Results: Ct Angio Chest Pe W/cm &/or Wo Cm  10/13/2013   CLINICAL DATA:  Chest tightness. Difficulty breathing. History of lower extremity deep venous thrombosis  EXAM: CT ANGIOGRAPHY CHEST WITH CONTRAST  TECHNIQUE: Multidetector CT imaging of the chest was performed using the standard protocol during bolus administration of intravenous contrast. Multiplanar CT image reconstructions and MIPs were obtained to evaluate the vascular anatomy.  CONTRAST:  75mL OMNIPAQUE IOHEXOL 350 MG/ML SOLN  COMPARISON:  None.  FINDINGS: There  is no demonstrable pulmonary embolus. There is no thoracic aortic aneurysm or dissection.  There is mild bibasilar atelectatic change. There is no frank edema or consolidation. There is lower lobe bronchiectatic change bilaterally. There is milder upper lobe bronchiectatic change bilaterally. There is no appreciable thoracic adenopathy. The pericardium is not thickened.  In the visualized upper abdomen there is hepatic steatosis. Visualized upper abdominal structures otherwise appear unremarkable. There is degenerative change in the thoracic spine. No blastic or lytic bone lesions. Visualized thyroid appears unremarkable.  Review of the MIP images confirms the above findings.  IMPRESSION: No demonstrable pulmonary embolus. No edema or consolidation. Mild lower lobe atelectatic change bilaterally. Evidence of bronchiectasis bilaterally, primarily in the lower lobes. There is hepatic steatosis.   Electronically Signed   By: Bretta Bang M.D.   On: 10/13/2013 07:38   Medications: I have reviewed the patient's current medications. Scheduled Meds: . aspirin EC  325 mg Oral Daily  . atorvastatin  10 mg Oral Daily  . warfarin  3 mg Oral q1800  . Warfarin - Physician Dosing Inpatient   Does not apply q1800   Continuous Infusions:  PRN Meds:.acetaminophen, ondansetron (ZOFRAN) IV Assessment/Plan: Active Problems:   History of pulmonary embolism   Chest pain   Hepatic steatosis   History of DVT (deep vein thrombosis)   Right thigh pain   Hyperlipidemia  63 yo male with hx of recurrent DVTs and massive PE on coumadin comes in with chest pain.  Chest pain - high concern for myocardial ischemia given heart score of 4. This occurred with exertion, also has some SOB with exertion. Lasted 20 mins. EKG normal and trops neg x3. - stress test today. Depending on the result, we will consult cardiology if abnormal for further recommendation, or send him home with follow up with PCP at [facility] if normal. -  continue aspirin 325mg  ASA. 81mg  on discharge.  Recurrent DVT and PE Had first DVT with massive PE on 1996 after knee surgery. Then had 2nd soon after while on anti-coag, had IVC placed but then had third DVT again. Patient stated he was on only 15mg  lovenox which might have been the reason for the 3rd massive DVT. Has been on coumadin since then. Has been stable INR on 3mg  coumadin - INR therapeutic. Continue coumadin 3mg .  Right thigh pain - chronic. Likely from previous large DVTs causing vascular dysfunction.  Acute vs chronic kidney disease - crt 1.3. No baseline  HLD   Dispo: Disposition is deferred at this time, awaiting improvement of current medical problems.  Anticipated discharge in approximately 1 day(s).   The patient does have a current PCP (No Pcp Per Patient) and does need an [facility] hospital follow-up appointment after discharge.  The patient does not have transportation limitations that hinder transportation to clinic appointments.  .Services Needed at time of discharge: Y = Yes, Blank = No PT:   OT:   RN:  Equipment:   Other:     LOS: 1 day   Hyacinth Meekerasrif Beldon Nowling, MD 10/14/2013, 12:04 PM

## 2013-10-14 NOTE — Accreditation Note (Signed)
Discharged to home with family office visits in place teaching done  

## 2013-10-14 NOTE — Discharge Summary (Signed)
Attending medicine discharge note: I personally interviewed and examined this patient on the day of discharge and I concur with the evaluation and discharge plans as recorded by resident physician Dr. Hyacinth Meekerasrif Ahmed. 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 He is on chronic anticoagulation. He presented for evaluation of sudden onset of right medial thigh pain followed by left pectoral pain radiating down his left arm. His Coumadin level is therapeutic. There were no acute changes on electrocardiogram. No elevation of cardiac enzymes. Normal CT angiogram of the chest with no evidence for recurrent pulmonary emboli. He underwent a exercise stress test which was normal.  He will be discharged in stable condition. [place] care physician in [place] and will schedule an appointment for followup should he have any new symptoms.  Cephas DarbyJames Tadeo Besecker, MD, FACP  Hematology-Oncology/Internal Medicine

## 2013-10-14 NOTE — H&P (Signed)
Medicine attending admission note: I personally interviewed and examined this patient and concur with the evaluation and management plan as outlined by resident physician Dr. Hyacinth Meekerasrif Ahmed.  Clinical summary: 63 year old man with no prior cardiac history. He is in excellent physical shape and works out on a regular basis. He suffered pulmonary emboli and a right lower extremity DVT in 1996. He was put on Coumadin. He developed a recurrent clot about 2 years later. A vena cava filter was placed at that time anticoagulation was continued. He presented on the day of the current admission with sudden onset of crampy intense pain in his right thigh. This felt similar to the pain he had when he had his blood clot but of more concern to him was that after the leg pain subsided he began to have pressure like pain in his left chest radiating down his arm and came to the hospital for further evaluation. He did admit to recent onset of dyspnea on exertion. No cough or hemoptysis.  On initial exam, blood pressure 138/73, pulse 68 regular, temperature 97.6, respirations 17, oxygen saturation 100% on room air. Heart and lung exams were normal. There is chronic swelling and extensive venous stasis changes of his right lower extremity.  Pertinent lab included a creatinine 1.3, hemoglobin 14.6, 27.3/2.53.  Electrocardiogram which I personally reviewed showed normal sinus rhythm at rate 60 with no acute ischemic changes. Serial troponin levels have been undetectable.  CT angiogram of the chest showed no evidence for acute pulmonary emboli. Mild lower lobe atelectasis. Incidentally noted was hepatic steatosis.  Current exam: Blood pressure 120/62, pulse 70, temperature 98.4 F (36.9 C), temperature source Oral, resp. rate 17, height 6\' 3"  (1.905 m), weight 230 lb (104.327 kg), SpO2 100.00%. No change from examination reported above. There is no pain on palpation along the right femoral canal.  Impression:   63 year old man with remote history of PE and right lower extremity DVT on chronic Coumadin anticoagulation, status post vena cava filter insertion, presents with transient right femoral pain with concomitant left pectoral pain radiating down his left arm. Coumadin levels are therapeutic. No evidence for recurrent pulmonary emboli. Leg and chest symptoms have already completely resolved. Electrocardiogram normal. Troponins flat. No evidence for acute myocardial injury.  As a precaution, we are going to go ahead and get a exercise stress test. If this is normal he can be discharged.

## 2013-10-14 NOTE — Progress Notes (Signed)
Utilization Review Completed.   Ayaat Jansma, RN, BSN Nurse Case Manager  

## 2013-10-15 ENCOUNTER — Other Ambulatory Visit: Payer: Self-pay | Admitting: Oncology
